# Patient Record
Sex: Male | Born: 1955 | Race: White | Hispanic: No | Marital: Married | Smoking: Current some day smoker
Health system: Southern US, Community
[De-identification: ages and names within clinical notes are randomized; demographics above are authoritative.]

## PROBLEM LIST (undated history)

## (undated) DIAGNOSIS — B1920 Unspecified viral hepatitis C without hepatic coma: Secondary | ICD-10-CM

## (undated) DIAGNOSIS — I10 Essential (primary) hypertension: Secondary | ICD-10-CM

---

## 2002-06-03 ENCOUNTER — Encounter: Payer: Self-pay | Admitting: Gastroenterology

## 2002-06-03 ENCOUNTER — Ambulatory Visit (HOSPITAL_COMMUNITY): Admission: RE | Admit: 2002-06-03 | Discharge: 2002-06-03 | Payer: Self-pay | Admitting: Gastroenterology

## 2002-06-03 ENCOUNTER — Encounter (INDEPENDENT_AMBULATORY_CARE_PROVIDER_SITE_OTHER): Payer: Self-pay | Admitting: Specialist

## 2004-01-04 ENCOUNTER — Encounter: Admission: RE | Admit: 2004-01-04 | Discharge: 2004-01-04 | Payer: Self-pay | Admitting: Family Medicine

## 2005-09-29 ENCOUNTER — Encounter: Admission: RE | Admit: 2005-09-29 | Discharge: 2005-09-29 | Payer: Self-pay | Admitting: Otolaryngology

## 2009-06-21 ENCOUNTER — Emergency Department (HOSPITAL_BASED_OUTPATIENT_CLINIC_OR_DEPARTMENT_OTHER): Admission: EM | Admit: 2009-06-21 | Discharge: 2009-06-21 | Payer: Self-pay | Admitting: Emergency Medicine

## 2009-06-21 ENCOUNTER — Ambulatory Visit: Payer: Self-pay | Admitting: Diagnostic Radiology

## 2010-07-14 IMAGING — CT CT HEAD W/O CM
1 series · 16 of 30 positions shown, 20 images · non-contrast
Comparison: 09/29/2005

CLINICAL DATA: Altered mental status

CT HEAD WITHOUT CONTRAST
TECHNIQUE: Contiguous axial images were obtained from the base of
the skull through the vertex without contrast.

[Series 2: head 4.8 h37s · axial · 0.46mm/px · z∈[+1197,+1334]mm · 16 of 32 slices shown, 20 images]
[im 2/32  brain]
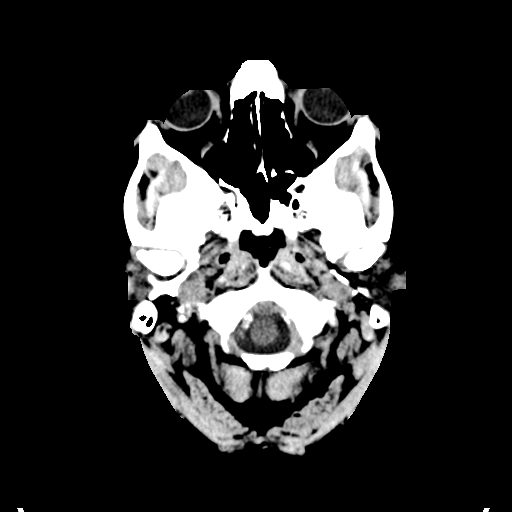
[im 2/32  bone]
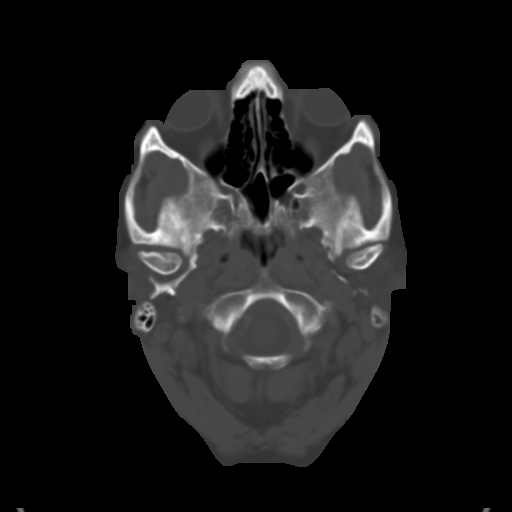
[im 4/32  brain]
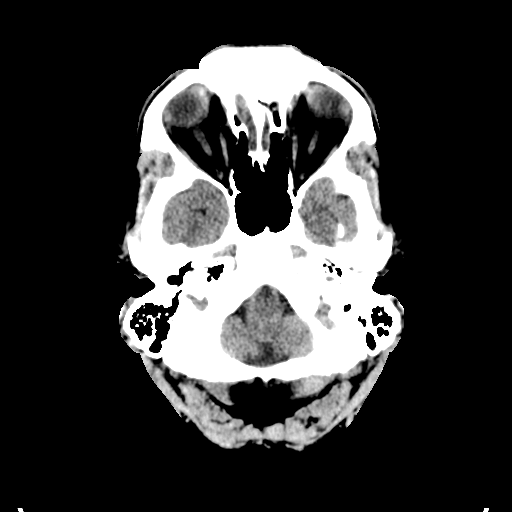
[im 6/32  brain]
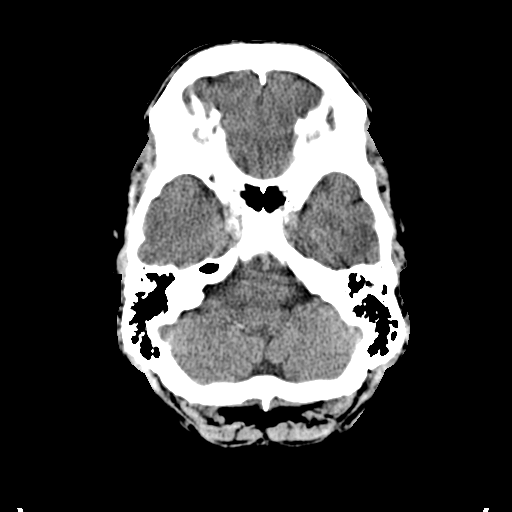
[im 8/32  brain]
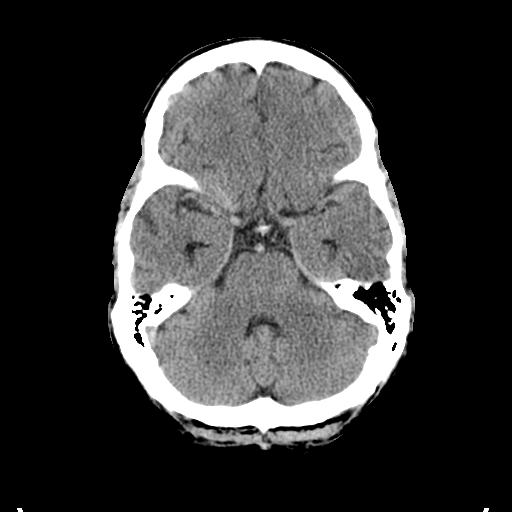
[im 9/32  brain]
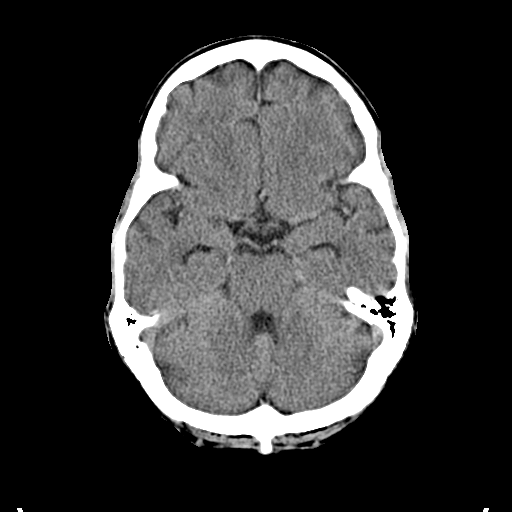
[im 9/32  bone]
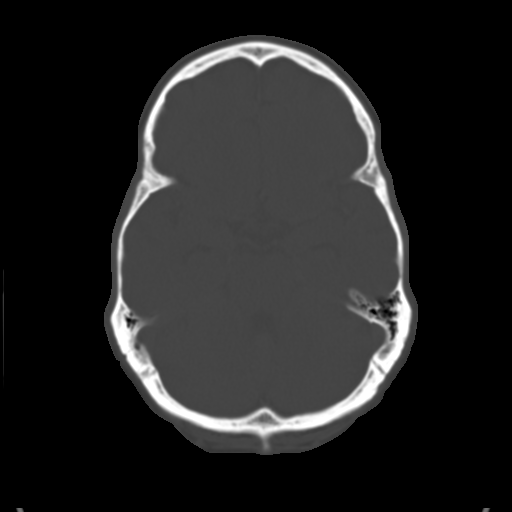
[im 11/32  brain]
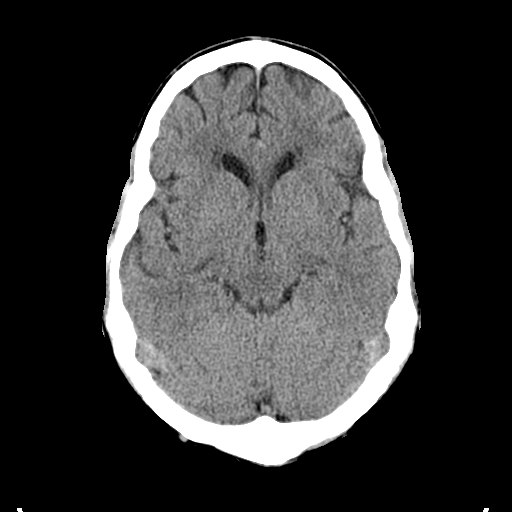
[im 13/32  brain]
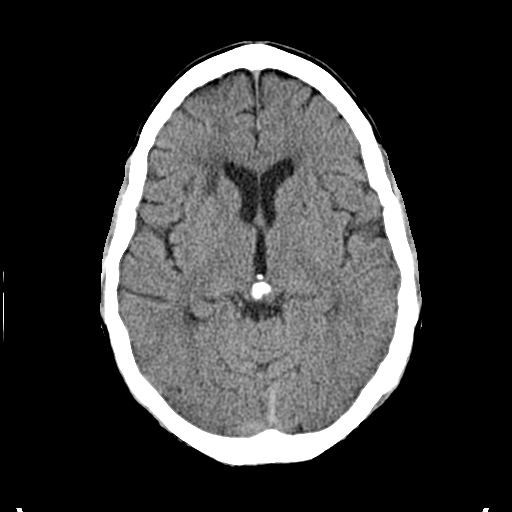
[im 15/32  brain]
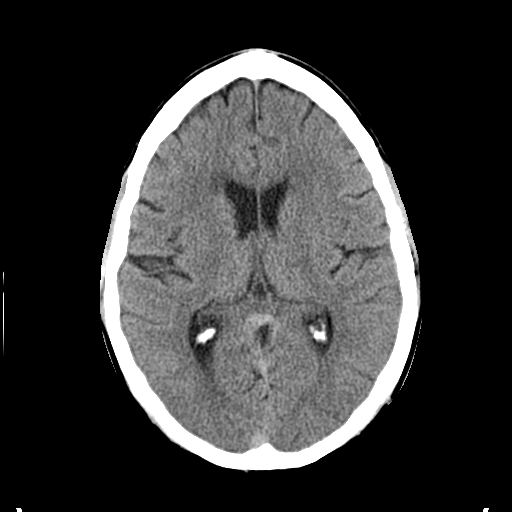
[im 17/32  brain]
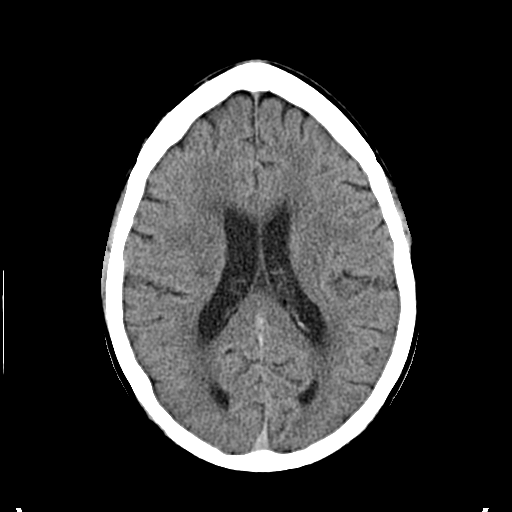
[im 17/32  bone]
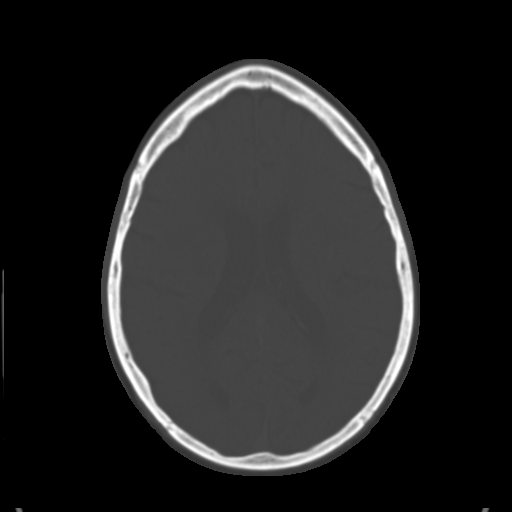
[im 19/32  brain]
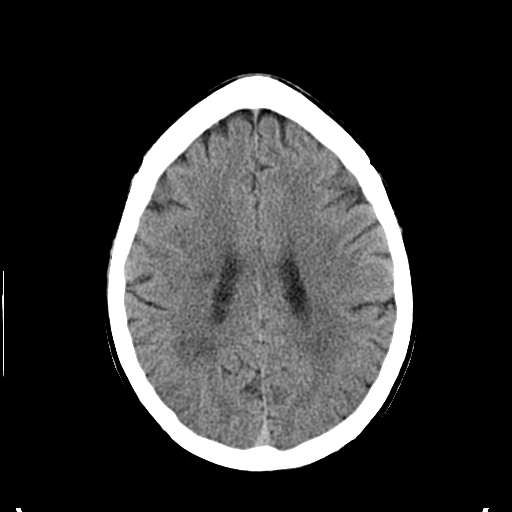
[im 21/32  brain]
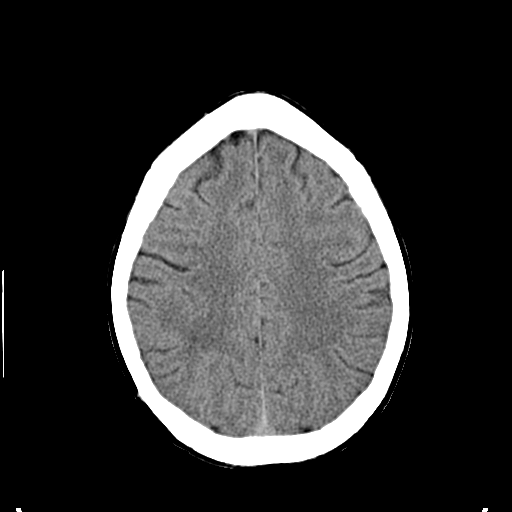
[im 23/32  brain]
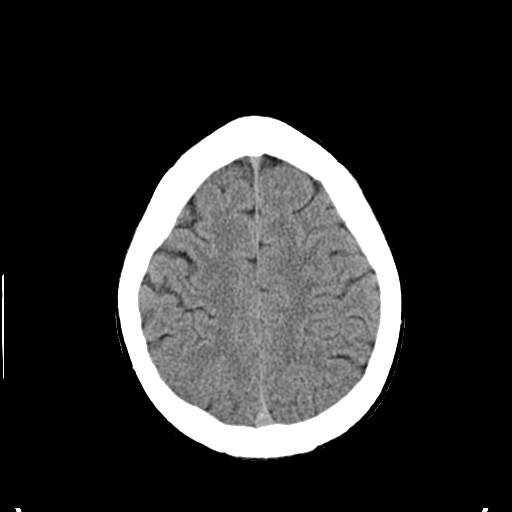
[im 24/32  brain]
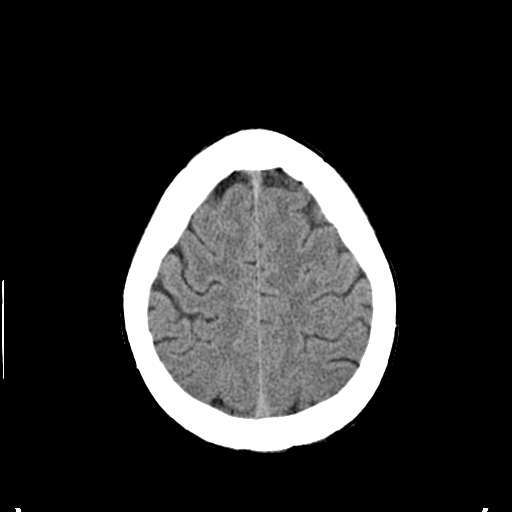
[im 24/32  bone]
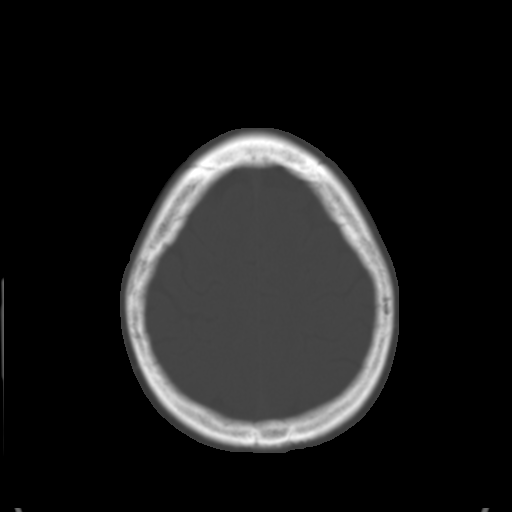
[im 26/32  brain]
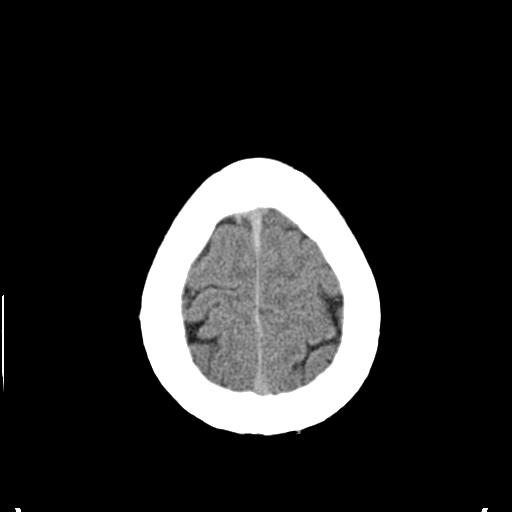
[im 28/32  brain]
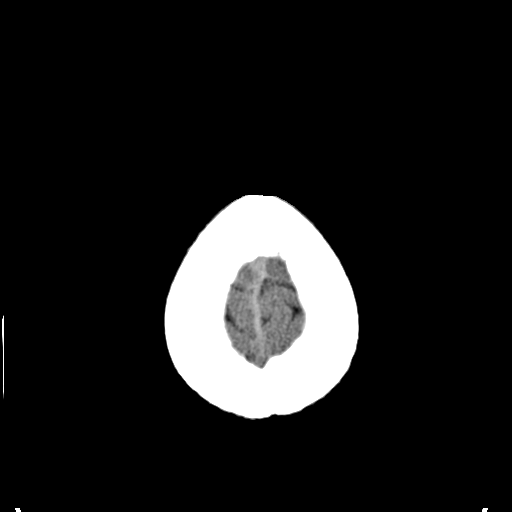
[im 30/32  brain]
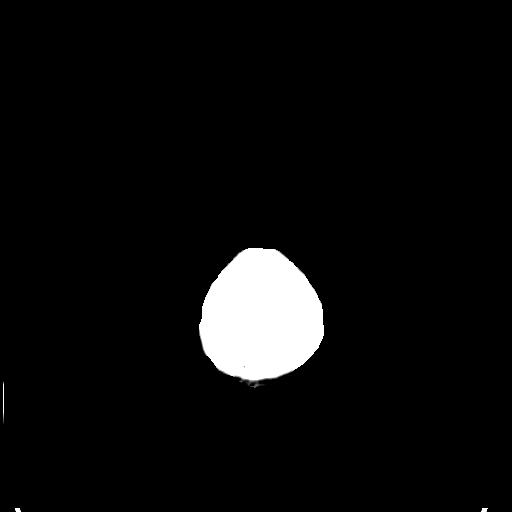

[16 of 30 positions shown; findings below may reference images not displayed]

FINDINGS: Chronic ischemic changes are present in the
periventricular white matter of the right parietal and frontal
lobes.  Small chronic infarct in the right corona radiata. No mass
effect, midline shift, or acute intracranial hemorrhage.  Mastoid
air cells and visualized paranasal sinuses are clear.
IMPRESSION: No acute intracranial pathology.  Chronic ischemic changes are
noted.

## 2010-07-14 IMAGING — CT CT ABDOMEN W/ CM
2 of 5 series · 16 of 46 positions shown, 18 images · IV contrast (APPLIED)
Comparison: CT abdomen pelvis 01/04/2004.

CT ABDOMEN

CLINICAL DATA: Intoxicated patient with elevated liver function
tests and acute confusion.

CT ABDOMEN AND PELVIS WITH CONTRAST  06/21/2009:
TECHNIQUE: Multidetector CT imaging of the abdomen and pelvis was
performed using the standard protocol following bolus
administration of intravenous contrast.
Contrast: 100 ml [id number] IV.

[Series 2: abd/pelvis 5.0 b31f · axial · 0.68mm/px · z∈[-430,-46]mm · 13 of 87 slices shown, 15 images]
[im 5/87  soft-tissue]
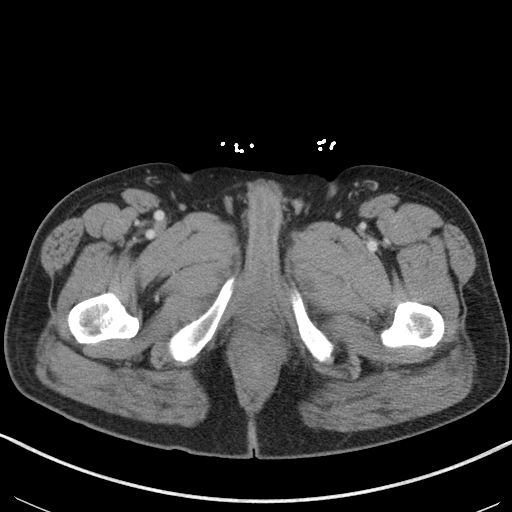
[im 5/87  bone]
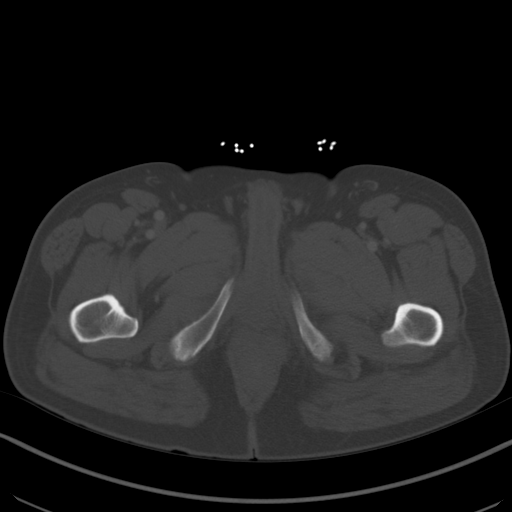
[im 14/87  soft-tissue]
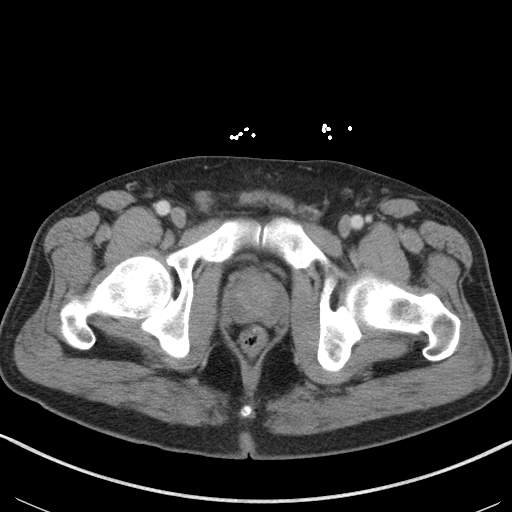
[im 19/87  soft-tissue]
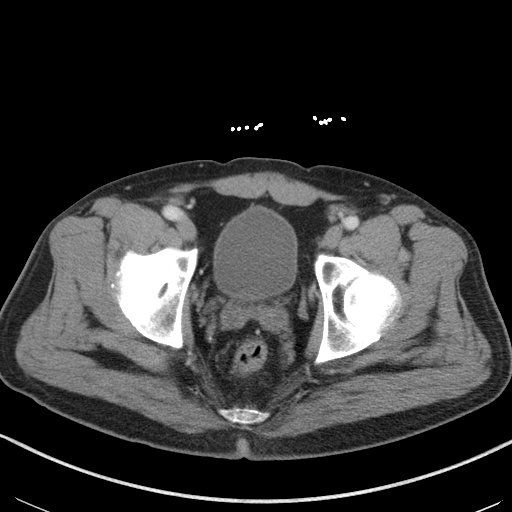
[im 23/87  soft-tissue]
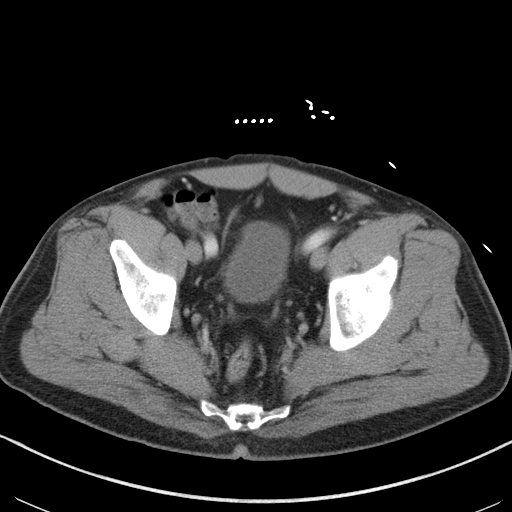
[im 32/87  soft-tissue]
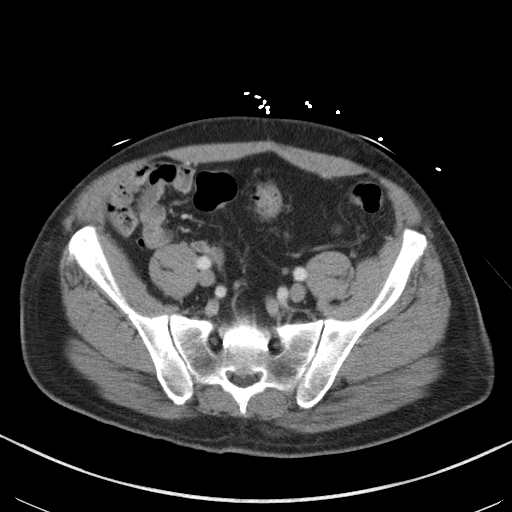
[im 37/87  soft-tissue]
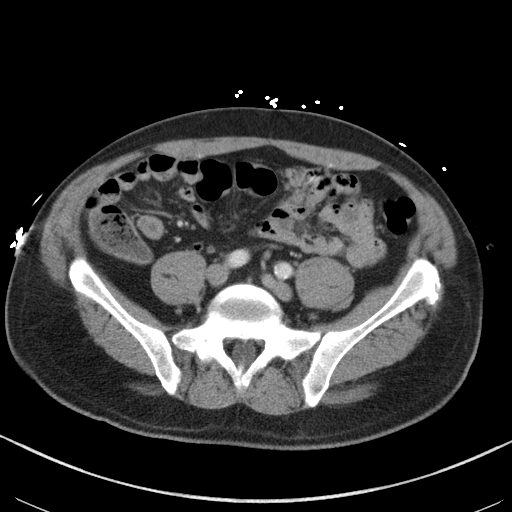
[im 46/87  soft-tissue]
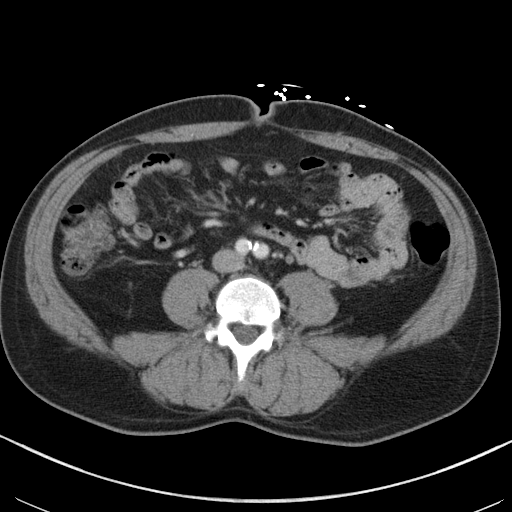
[im 50/87  soft-tissue]
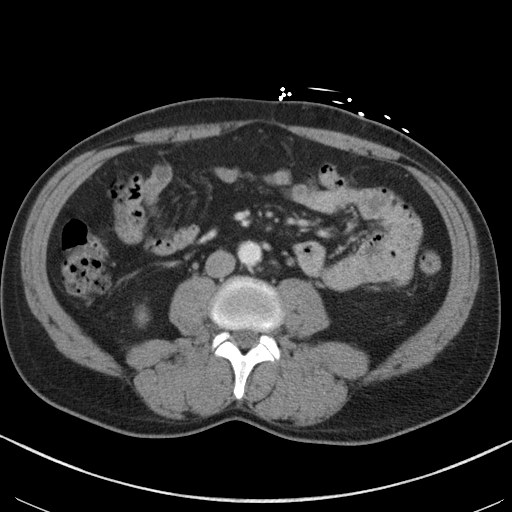
[im 55/87  soft-tissue]
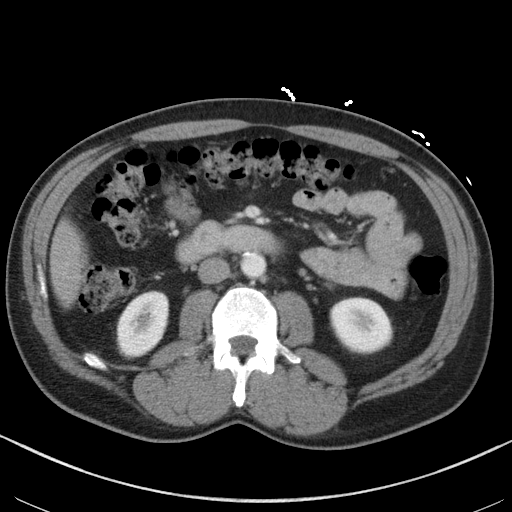
[im 55/87  bone]
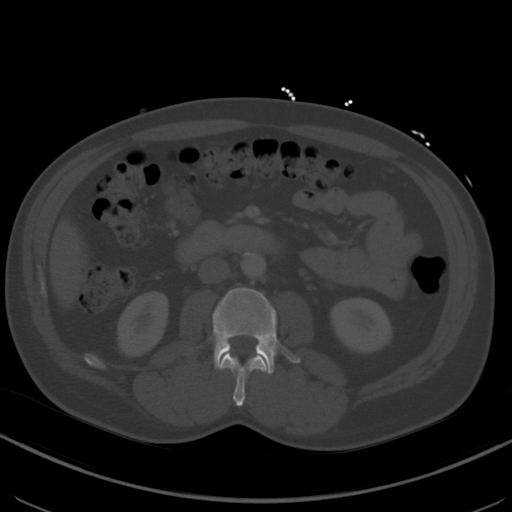
[im 64/87  soft-tissue]
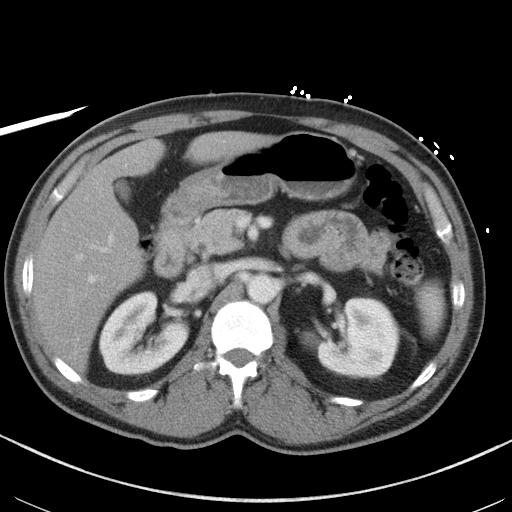
[im 68/87  soft-tissue]
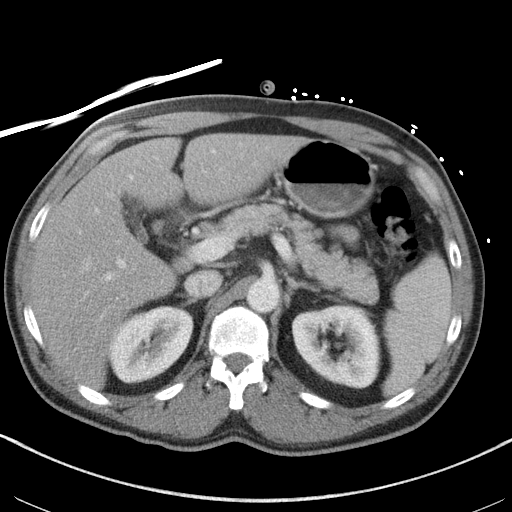
[im 73/87  soft-tissue]
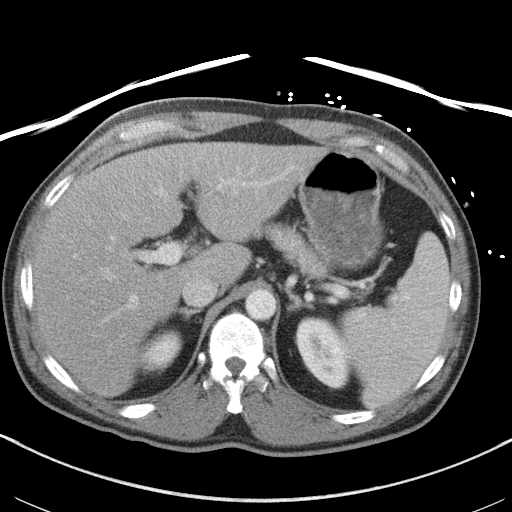
[im 82/87  soft-tissue]
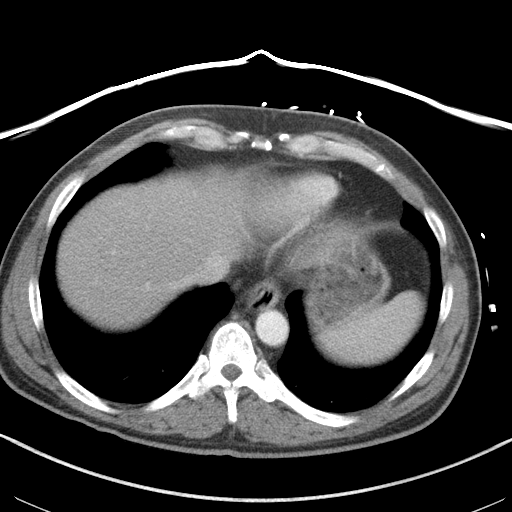

[Series 5: abd/pelvis 3.0 coronal · coronal · 0.66mm/px · 3 of 78 slices shown]
[im 26/78  soft-tissue]
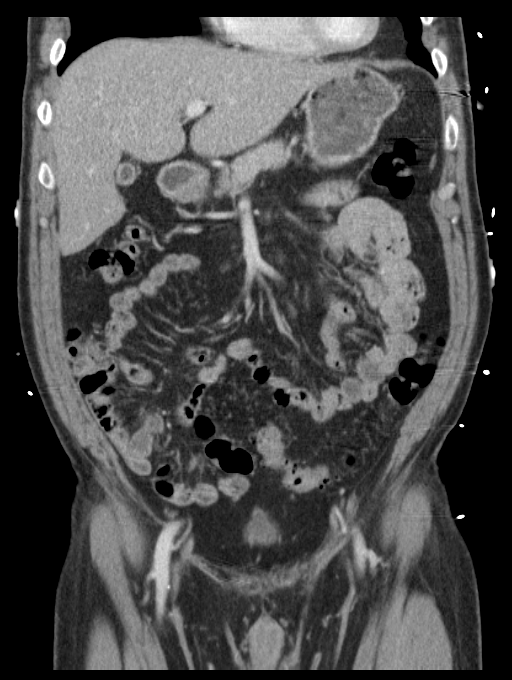
[im 35/78  soft-tissue]
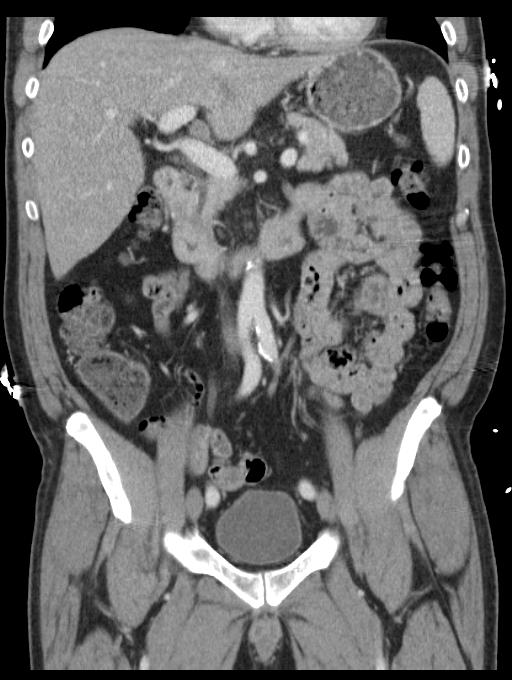
[im 43/78  soft-tissue]
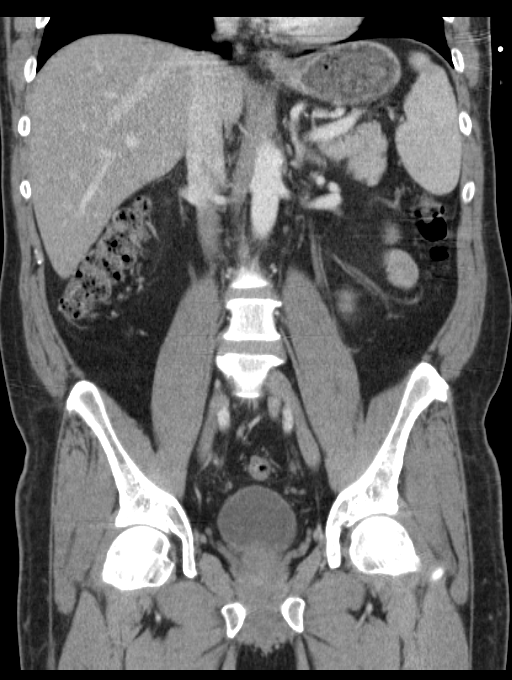

[16 of 46 positions shown; findings below may reference images not displayed]

FINDINGS: Mild diffuse fatty infiltration of the liver without
focal hepatic abnormality.  Normal appearing spleen, pancreas, and
adrenal glands.  Simple cysts arising from the lower pole of the
right kidney as before.  No significant abnormality involving
either kidney.  Large amount of food within a normal-appearing
stomach.  Contracted gallbladder which is otherwise unremarkable.
No biliary ductal dilation.  Visualized small bowel and colon
unremarkable.  Distal abdominal aortic atherosclerosis without
aneurysm.  No significant lymphadenopathy.  No ascites.  Visualized
lung bases clear apart from the expected dependent atelectasis
posteriorly.  Bone window images demonstrate mild degenerative
changes in the lower lumbar spine.
IMPRESSION: 1.  Mild diffuse fatty infiltration of the liver without focal
hepatic abnormality.
2.  No acute abnormalities otherwise in the abdomen.

CT PELVIS
FINDINGS: Iliofemoral atherosclerosis without aneurysm.  Sigmoid
colon diverticulosis without evidence of acute diverticulitis.
Normal appendix in the right upper pelvis.  Visualized small bowel
unremarkable.  Urinary bladder decompressed and unremarkable.
Prostate gland upper normal in size.  Normal seminal vesicles.  No
ascites.  No significant lymphadenopathy.  Bone window images
unremarkable.
IMPRESSION: 1.  No acute abnormalities in the pelvis.
2.  Sigmoid colon diverticulosis.

## 2011-04-07 LAB — URINALYSIS, ROUTINE W REFLEX MICROSCOPIC
Bilirubin Urine: NEGATIVE
Glucose, UA: NEGATIVE mg/dL
Hgb urine dipstick: NEGATIVE
Specific Gravity, Urine: 1.013 (ref 1.005–1.030)
Urobilinogen, UA: 0.2 mg/dL (ref 0.0–1.0)
pH: 6.5 (ref 5.0–8.0)

## 2011-04-07 LAB — CBC
HCT: 48.7 % (ref 39.0–52.0)
Hemoglobin: 16.8 g/dL (ref 13.0–17.0)
MCHC: 34.4 g/dL (ref 30.0–36.0)
Platelets: 215 10*3/uL (ref 150–400)
RDW: 11.9 % (ref 11.5–15.5)

## 2011-04-07 LAB — POCT TOXICOLOGY PANEL

## 2011-04-07 LAB — ETHANOL: Alcohol, Ethyl (B): 156 mg/dL — ABNORMAL HIGH (ref 0–10)

## 2011-04-07 LAB — COMPREHENSIVE METABOLIC PANEL
Albumin: 3.9 g/dL (ref 3.5–5.2)
BUN: 9 mg/dL (ref 6–23)
Calcium: 9 mg/dL (ref 8.4–10.5)
Glucose, Bld: 99 mg/dL (ref 70–99)
Potassium: 3.7 mEq/L (ref 3.5–5.1)
Total Protein: 7.1 g/dL (ref 6.0–8.3)

## 2011-04-07 LAB — AMMONIA: Ammonia: 28 umol/L (ref 11–35)

## 2011-04-07 LAB — DIFFERENTIAL
Lymphocytes Relative: 40 % (ref 12–46)
Lymphs Abs: 2.4 10*3/uL (ref 0.7–4.0)
Monocytes Absolute: 0.6 10*3/uL (ref 0.1–1.0)
Monocytes Relative: 9 % (ref 3–12)
Neutro Abs: 3 10*3/uL (ref 1.7–7.7)
Neutrophils Relative %: 49 % (ref 43–77)

## 2011-04-07 LAB — POCT CARDIAC MARKERS
CKMB, poc: <1 ng/mL — ABNORMAL LOW (ref 1.0–8.0)
Troponin i, poc: <0.05 ng/mL (ref 0.00–0.09)

## 2012-03-08 ENCOUNTER — Other Ambulatory Visit: Payer: Self-pay | Admitting: Gastroenterology

## 2014-04-01 ENCOUNTER — Encounter (HOSPITAL_BASED_OUTPATIENT_CLINIC_OR_DEPARTMENT_OTHER): Payer: Self-pay | Admitting: Emergency Medicine

## 2014-04-01 ENCOUNTER — Emergency Department (HOSPITAL_BASED_OUTPATIENT_CLINIC_OR_DEPARTMENT_OTHER)
Admission: EM | Admit: 2014-04-01 | Discharge: 2014-04-01 | Disposition: A | Discharge status: Home / Self Care | Payer: No Typology Code available for payment source | Attending: Emergency Medicine | Admitting: Emergency Medicine

## 2014-04-01 DIAGNOSIS — R1011 Right upper quadrant pain: Secondary | ICD-10-CM | POA: Insufficient documentation

## 2014-04-01 DIAGNOSIS — Z8619 Personal history of other infectious and parasitic diseases: Secondary | ICD-10-CM | POA: Insufficient documentation

## 2014-04-01 DIAGNOSIS — R160 Hepatomegaly, not elsewhere classified: Secondary | ICD-10-CM | POA: Insufficient documentation

## 2014-04-01 DIAGNOSIS — I10 Essential (primary) hypertension: Secondary | ICD-10-CM | POA: Insufficient documentation

## 2014-04-01 DIAGNOSIS — R630 Anorexia: Secondary | ICD-10-CM | POA: Insufficient documentation

## 2014-04-01 DIAGNOSIS — F17200 Nicotine dependence, unspecified, uncomplicated: Secondary | ICD-10-CM | POA: Insufficient documentation

## 2014-04-01 DIAGNOSIS — R109 Unspecified abdominal pain: Secondary | ICD-10-CM

## 2014-04-01 HISTORY — DX: Unspecified viral hepatitis C without hepatic coma: B19.20

## 2014-04-01 HISTORY — DX: Essential (primary) hypertension: I10

## 2014-04-01 LAB — URINALYSIS, ROUTINE W REFLEX MICROSCOPIC
GLUCOSE, UA: NEGATIVE mg/dL
HGB URINE DIPSTICK: NEGATIVE
Ketones, ur: 15 mg/dL — AB
Leukocytes, UA: NEGATIVE
Nitrite: NEGATIVE
PH: 6 (ref 5.0–8.0)
PROTEIN: NEGATIVE mg/dL
Specific Gravity, Urine: 1.025 (ref 1.005–1.030)
Urobilinogen, UA: 0.2 mg/dL (ref 0.0–1.0)

## 2014-04-01 MED ORDER — VALSARTAN 80 MG PO TABS: 80.0000 mg | ORAL_TABLET | Freq: Every day | ORAL | Status: AC

## 2014-04-01 MED ORDER — AMLODIPINE BESYLATE 10 MG PO TABS: 10.0000 mg | ORAL_TABLET | Freq: Every day | ORAL | Status: AC

## 2014-04-01 NOTE — Discharge Instructions (Signed)
Arterial Hypertension °Arterial hypertension (high blood pressure) is a condition of elevated pressure in your blood vessels. Hypertension over a long period of time is a risk factor for strokes, heart attacks, and heart failure. It is also the leading cause of kidney (renal) failure.  °CAUSES  °· In Adults -- Over 90% of all hypertension has no known cause. This is called essential or primary hypertension. In the other 10% of people with hypertension, the increase in blood pressure is caused by another disorder. This is called secondary hypertension. Important causes of secondary hypertension are: °· Heavy alcohol use. °· Obstructive sleep apnea. °· Hyperaldosterosim (Conn's syndrome). °· Steroid use. °· Chronic kidney failure. °· Hyperparathyroidism. °· Medications. °· Renal artery stenosis. °· Pheochromocytoma. °· Cushing's disease. °· Coarctation of the aorta. °· Scleroderma renal crisis. °· Licorice (in excessive amounts). °· Drugs (cocaine, methamphetamine). °Your caregiver can explain any items above that apply to you. °· In Children -- Secondary hypertension is more common and should always be considered. °· Pregnancy -- Few women of childbearing age have high blood pressure. However, up to 10% of them develop hypertension of pregnancy. Generally, this will not harm the woman. It may be a sign of 3 complications of pregnancy: preeclampsia, HELLP syndrome, and eclampsia. Follow up and control with medication is necessary. °SYMPTOMS  °· This condition normally does not produce any noticeable symptoms. It is usually found during a routine exam. °· Malignant hypertension is a late problem of high blood pressure. It may have the following symptoms: °· Headaches. °· Blurred vision. °· End-organ damage (this means your kidneys, heart, lungs, and other organs are being damaged). °· Stressful situations can increase the blood pressure. If a person with normal blood pressure has their blood pressure go up while being  seen by their caregiver, this is often termed "white coat hypertension." Its importance is not known. It may be related with eventually developing hypertension or complications of hypertension. °· Hypertension is often confused with mental tension, stress, and anxiety. °DIAGNOSIS  °The diagnosis is made by 3 separate blood pressure measurements. They are taken at least 1 week apart from each other. If there is organ damage from hypertension, the diagnosis may be made without repeat measurements. °Hypertension is usually identified by having blood pressure readings: °· Above 140/90 mmHg measured in both arms, at 3 separate times, over a couple weeks. °· Over 130/80 mmHg should be considered a risk factor and may require treatment in patients with diabetes. °Blood pressure readings over 120/80 mmHg are called "pre-hypertension" even in non-diabetic patients. °To get a true blood pressure measurement, use the following guidelines. Be aware of the factors that can alter blood pressure readings. °· Take measurements at least 1 hour after caffeine. °· Take measurements 30 minutes after smoking and without any stress. This is another reason to quit smoking  it raises your blood pressure. °· Use a proper cuff size. Ask your caregiver if you are not sure about your cuff size. °· Most home blood pressure cuffs are automatic. They will measure systolic and diastolic pressures. The systolic pressure is the pressure reading at the start of sounds. Diastolic pressure is the pressure at which the sounds disappear. If you are elderly, measure pressures in multiple postures. Try sitting, lying or standing. °· Sit at rest for a minimum of 5 minutes before taking measurements. °· You should not be on any medications like decongestants. These are found in many cold medications. °· Record your blood pressure readings and review   them with your caregiver. °If you have hypertension: °· Your caregiver may do tests to be sure you do not have  secondary hypertension (see "causes" above). °· Your caregiver may also look for signs of metabolic syndrome. This is also called Syndrome X or Insulin Resistance Syndrome. You may have this syndrome if you have type 2 diabetes, abdominal obesity, and abnormal blood lipids in addition to hypertension. °· Your caregiver will take your medical and family history and perform a physical exam. °· Diagnostic tests may include blood tests (for glucose, cholesterol, potassium, and kidney function), a urinalysis, or an EKG. Other tests may also be necessary depending on your condition. °PREVENTION  °There are important lifestyle issues that you can adopt to reduce your chance of developing hypertension: °· Maintain a normal weight. °· Limit the amount of salt (sodium) in your diet. °· Exercise often. °· Limit alcohol intake. °· Get enough potassium in your diet. Discuss specific advice with your caregiver. °· Follow a DASH diet (dietary approaches to stop hypertension). This diet is rich in fruits, vegetables, and low-fat dairy products, and avoids certain fats. °PROGNOSIS  °Essential hypertension cannot be cured. Lifestyle changes and medical treatment can lower blood pressure and reduce complications. The prognosis of secondary hypertension depends on the underlying cause. Many people whose hypertension is controlled with medicine or lifestyle changes can live a normal, healthy life.  °RISKS AND COMPLICATIONS  °While high blood pressure alone is not an illness, it often requires treatment due to its short- and long-term effects on many organs. Hypertension increases your risk for: °· CVAs or strokes (cerebrovascular accident). °· Heart failure due to chronically high blood pressure (hypertensive cardiomyopathy). °· Heart attack (myocardial infarction). °· Damage to the retina (hypertensive retinopathy). °· Kidney failure (hypertensive nephropathy). °Your caregiver can explain list items above that apply to you. Treatment  of hypertension can significantly reduce the risk of complications. °TREATMENT  °· For overweight patients, weight loss and regular exercise are recommended. Physical fitness lowers blood pressure. °· Mild hypertension is usually treated with diet and exercise. A diet rich in fruits and vegetables, fat-free dairy products, and foods low in fat and salt (sodium) can help lower blood pressure. Decreasing salt intake decreases blood pressure in a 1/3 of people. °· Stop smoking if you are a smoker. °The steps above are highly effective in reducing blood pressure. While these actions are easy to suggest, they are difficult to achieve. Most patients with moderate or severe hypertension end up requiring medications to bring their blood pressure down to a normal level. There are several classes of medications for treatment. Blood pressure pills (antihypertensives) will lower blood pressure by their different actions. Lowering the blood pressure by 10 mmHg may decrease the risk of complications by as much as 25%. °The goal of treatment is effective blood pressure control. This will reduce your risk for complications. Your caregiver will help you determine the best treatment for you according to your lifestyle. What is excellent treatment for one person, may not be for you. °HOME CARE INSTRUCTIONS  °· Do not smoke. °· Follow the lifestyle changes outlined in the "Prevention" section. °· If you are on medications, follow the directions carefully. Blood pressure medications must be taken as prescribed. Skipping doses reduces their benefit. It also puts you at risk for problems. °· Follow up with your caregiver, as directed. °· If you are asked to monitor your blood pressure at home, follow the guidelines in the "Diagnosis" section above. °SEEK MEDICAL CARE   IF:  °· You think you are having medication side effects. °· You have recurrent headaches or lightheadedness. °· You have swelling in your ankles. °· You have trouble with  your vision. °SEEK IMMEDIATE MEDICAL CARE IF:  °· You have sudden onset of chest pain or pressure, difficulty breathing, or other symptoms of a heart attack. °· You have a severe headache. °· You have symptoms of a stroke (such as sudden weakness, difficulty speaking, difficulty walking). °MAKE SURE YOU:  °· Understand these instructions. °· Will watch your condition. °· Will get help right away if you are not doing well or get worse. °Document Released: 12/15/2005 Document Revised: 03/08/2012 Document Reviewed: 07/15/2007 °[employer]® Patient Information ©2014 [employer]. ° °

## 2014-04-01 NOTE — ED Provider Notes (Addendum)
CSN: [id number]     Arrival date & time 04/01/14  1249 History   First MD Initiated Contact with Patient 04/01/14 1252     Chief Complaint  Patient presents with  . Flank Pain     (Consider location/radiation/quality/duration/timing/severity/associated sxs/prior Treatment) Patient is a 58 y.o. male presenting with flank pain. The history is provided by the patient.  Flank Pain This is a new problem. Episode onset: 3-4 days ago. Episode frequency: intermittent. The problem has not changed since onset.Pertinent negatives include no chest pain, no abdominal pain and no shortness of breath. Associated symptoms comments: Darker urine and states has not had much of an appetite.  Pt denies N/V/D.  Not worsened with food.  Pain is worse at night and seems to be worse over the left flank. Nothing aggravates the symptoms. Nothing relieves the symptoms. He has tried nothing for the symptoms. The treatment provided no relief.    Past Medical History  Diagnosis Date  . Hypertension   . Hepatitis C    History reviewed. No pertinent past surgical history. No family history on file. History  Substance Use Topics  . Smoking status: Current Some Day Smoker  . Smokeless tobacco: Not on file  . Alcohol Use: Yes    Review of Systems  Respiratory: Negative for shortness of breath.   Cardiovascular: Negative for chest pain.  Gastrointestinal: Negative for abdominal pain.  Genitourinary: Positive for flank pain.  All other systems reviewed and are negative.      Allergies  Review of patient's allergies indicates no known allergies.  Home Medications  No current outpatient prescriptions on file. BP 183/118  Pulse 72  Temp(Src) 97.9 F (36.6 C) (Oral)  Resp 18  SpO2 100% Physical Exam  Nursing note and vitals reviewed. Constitutional: He is oriented to person, place, and time. He appears well-developed and well-nourished. No distress.  HENT:  Head: Normocephalic and atraumatic.   Mouth/Throat: Oropharynx is clear and moist.  Eyes: Conjunctivae and EOM are normal. Pupils are equal, round, and reactive to light.  Neck: Normal range of motion. Neck supple.  Cardiovascular: Normal rate, regular rhythm and intact distal pulses.   No murmur heard. Pulmonary/Chest: Effort normal and breath sounds normal. No respiratory distress. He has no wheezes. He has no rales.  Abdominal: Soft. Normal appearance. He exhibits no distension. There is tenderness in the right upper quadrant. There is CVA tenderness. There is no rebound and no guarding.  Mild enlargement of the liver and mild tenderness.  Tenderness over bilateral flank  Musculoskeletal: Normal range of motion. He exhibits no edema and no tenderness.  Neurological: He is alert and oriented to person, place, and time.  Skin: Skin is warm and dry. No rash noted. No erythema.  Psychiatric: He has a normal mood and affect. His behavior is normal.    ED Course  Procedures (including critical care time) Labs Review Labs Reviewed  URINALYSIS, ROUTINE W REFLEX MICROSCOPIC - Abnormal; Notable for the following:    Color, Urine AMBER (*)    Bilirubin Urine SMALL (*)    Ketones, ur 15 (*)    All other components within normal limits   Imaging Review No results found.   EKG Interpretation None      MDM   Final diagnoses:  Flank pain  Hypertension    Patient presenting four-day history of bilateral flank pain worse on the left that seems to be worse at night and is very sharp. He is known exacerbating factors no  associated symptoms except that his urine has looked darker and his appetite has not been normal. She has a history of alcohol use and hepatitis C. He usually drinks 4 glasses of wine daily which she states he's been doing for some time. She denies any painful urination and no prior history of kidney stones. Patient is hypertensive here but he has a history of hypertension and is not currently taking  medications. Pulses are intact with normal perfusion throughout. He denies any chest pain or shortness of breath. Symptoms are not classic for renal colic however could be atypical renal colic versus urinary tract infection versus a liver pathology or pancreatitis.  Low suspicion for AAA or dissection. However patient is refusing all blood work her CT scans and is requesting a urine be checked at this time.  1:40 PM UA without signs of blood or infection.  Findings discussed with pt and he states that he is feeling better now without intervention.  He refuses blood work at this time but states he does have the bottles of blood pressure medication in his car he is supposed to be taking but is out.  Will refill bp meds and pt encouraged to return if symptoms worsen.  Gwyneth Sprout, MD 04/01/14 1341  Gwyneth Sprout, MD 04/01/14 1350

## 2014-04-01 NOTE — ED Notes (Signed)
L side flank pain for four days, decrease in urination

## 2014-07-21 ENCOUNTER — Encounter (HOSPITAL_BASED_OUTPATIENT_CLINIC_OR_DEPARTMENT_OTHER): Payer: Self-pay | Admitting: Emergency Medicine

## 2014-07-21 ENCOUNTER — Emergency Department (HOSPITAL_BASED_OUTPATIENT_CLINIC_OR_DEPARTMENT_OTHER)
Admission: EM | Admit: 2014-07-21 | Discharge: 2014-07-21 | Disposition: A | Discharge status: Home / Self Care | Payer: No Typology Code available for payment source | Attending: Emergency Medicine | Admitting: Emergency Medicine

## 2014-07-21 DIAGNOSIS — H8112 Benign paroxysmal vertigo, left ear: Secondary | ICD-10-CM

## 2014-07-21 DIAGNOSIS — Z79899 Other long term (current) drug therapy: Secondary | ICD-10-CM | POA: Insufficient documentation

## 2014-07-21 DIAGNOSIS — I10 Essential (primary) hypertension: Secondary | ICD-10-CM | POA: Insufficient documentation

## 2014-07-21 DIAGNOSIS — R42 Dizziness and giddiness: Secondary | ICD-10-CM | POA: Insufficient documentation

## 2014-07-21 DIAGNOSIS — F17200 Nicotine dependence, unspecified, uncomplicated: Secondary | ICD-10-CM | POA: Insufficient documentation

## 2014-07-21 DIAGNOSIS — Z8619 Personal history of other infectious and parasitic diseases: Secondary | ICD-10-CM | POA: Insufficient documentation

## 2014-07-21 DIAGNOSIS — H8110 Benign paroxysmal vertigo, unspecified ear: Secondary | ICD-10-CM | POA: Insufficient documentation

## 2014-07-21 MED ORDER — MECLIZINE HCL 25 MG PO TABS
50.0000 mg | ORAL_TABLET | Freq: Once | ORAL | Status: AC
  Administered 2014-07-21: 50 mg via ORAL
  Filled 2014-07-21: qty 2

## 2014-07-21 MED ORDER — MECLIZINE HCL 50 MG PO TABS: 50.0000 mg | ORAL_TABLET | Freq: Three times a day (TID) | ORAL | Status: AC | PRN

## 2014-07-21 NOTE — ED Notes (Signed)
Pt to room 1 in w/c, cg@ provided for transfer to bed. Pt reports sudden onset of left ear pain and "the earth started spinning". Pt states he vomited twice. Pt states the room is no longer spinning nor does he currently feel nauseaous, c/o "buzzing" in his left ear.

## 2014-07-21 NOTE — ED Provider Notes (Signed)
CSN: [id number]     Arrival date & time 07/21/14  1502 History   First MD Initiated Contact with Patient 07/21/14 1511     Chief Complaint  Patient presents with  . Dizziness     (Consider location/radiation/quality/duration/timing/severity/associated sxs/prior Treatment) HPI  Brian Richards is a 58 y.o. male with past medical history significant for hepatitis C and hypertension complaining of acute onset of vertiginous sensation this with nausea this a.m. Patient has had 4 episodes today. Associated symptoms of tinnitus in the left ear and states that he can't hear as well: " like was on a plane." Patient denies any ataxia, prior similar episodes, chest pain, shortness of breath, emesis, otalgia, runny nose, fever, chills.  Past Medical History  Diagnosis Date  . Hypertension   . Hepatitis C    History reviewed. No pertinent past surgical history. History reviewed. No pertinent family history. History  Substance Use Topics  . Smoking status: Current Some Day Smoker  . Smokeless tobacco: Not on file  . Alcohol Use: Yes    Review of Systems  10 systems reviewed and found to be negative, except as noted in the HPI.   Allergies  Review of patient's allergies indicates no known allergies.  Home Medications   Prior to Admission medications   Medication Sig Start Date End Date Taking? Authorizing Provider  amLODipine (NORVASC) 10 MG tablet Take 1 tablet (10 mg total) by mouth daily. 04/01/14   Gwyneth SproutWhitney Plunkett, MD  meclizine (ANTIVERT) 50 MG tablet Take 1 tablet (50 mg total) by mouth 3 (three) times daily as needed for dizziness or nausea. 07/21/14   Luisalberto Beegle, PA-C  meclizine (ANTIVERT) 50 MG tablet Take 1 tablet (50 mg total) by mouth 3 (three) times daily as needed for dizziness or nausea. 07/21/14   Aunya Lemler, PA-C  valsartan (DIOVAN) 80 MG tablet Take 1 tablet (80 mg total) by mouth daily. 04/01/14   Gwyneth SproutWhitney Plunkett, MD   BP 160/96  Pulse 72  Temp(Src) 97.5 F  (36.4 C) (Oral)  Resp 18  SpO2 100% Physical Exam  Nursing note and vitals reviewed. Constitutional: He is oriented to person, place, and time. He appears well-developed and well-nourished. No distress.  HENT:  Head: Normocephalic.  Eyes: Conjunctivae and EOM are normal.  Cardiovascular: Normal rate and regular rhythm.   Pulmonary/Chest: Effort normal and breath sounds normal. No stridor.  Abdominal: Soft. Bowel sounds are normal.  Musculoskeletal: Normal range of motion.  Neurological: He is alert and oriented to person, place, and time.  Hints exam negative. Negative Dicks Hallpike bilaterally.  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 Sensation intact.   II-Visual fields grossly intact. III/IV/VI-Extraocular movements intact.  Pupils reactive bilaterally. V/VII-Smile symmetric, equal eyebrow raise,  facial sensation intact VIII- Hearing grossly intact IX/X-Normal gag XI-bilateral shoulder shrug XII-midline tongue extension Cerebellar: Normal finger-to-nose  and normal heel-to-shin test.   Romberg negative Ambulates with a coordinated gait   Psychiatric: He has a normal mood and affect.    ED Course  Procedures (including critical care time) Labs Review Labs Reviewed - No data to display  Imaging Review No results found.   EKG Interpretation   Date/Time:  Friday July 21 2014 15:18:07 EDT Ventricular Rate:  90 PR Interval:  118 QRS Duration: 82 QT Interval:  384 QTC Calculation: 469 R Axis:   60 Text Interpretation:  Normal sinus  rhythm Normal ECG since last tracing no  significant change Confirmed by BELFI  MD, MELANIE ([id number]) on 07/21/2014  4:05:53 PM      MDM   Final diagnoses:  BPPV (benign paroxysmal positional vertigo), left    Filed Vitals:   07/21/14 1513  BP: 160/96  Pulse: 72  Temp: 97.5 F (36.4 C)  TempSrc: Oral  Resp: 18   SpO2: 100%    Medications  meclizine (ANTIVERT) tablet 50 mg (50 mg Oral Given 07/21/14 1541)    Brian Richards is a 58 y.o. male presenting with 4 episodes of vertiginous sensation short-lived, acute onset today. Neuro exam is nonfocal, typical for his baseline (note the patient has foot drop secondary to pinched nerve). Vertigo is resolved at this point. Patient has negative hints exam. Negative Dicks Hallpike. I presented him out information on Apley maneuver. I have written him a prescription for meclizine. We have had extensive discussions on importance of following with primary care. This appears to be a peripheral vertigo. There are no signs of central lesion. In fact, vertigo appears to be resolved at this time completely. I have advised him not to do Dix-Hallpike or use the meclizine unless symptoms return. I've given him in his and throat followup however, stated it is most important to follow with his primary care physician.  Evaluation does not show pathology that would require ongoing emergent intervention or inpatient treatment. Pt is hemodynamically stable and mentating appropriately. Discussed findings and plan with patient/guardian, who agrees with care plan. All questions answered. Return precautions discussed and outpatient follow up given.   Discharge Medication List as of 07/21/2014  4:11 PM    START taking these medications   Details  meclizine (ANTIVERT) 50 MG tablet Take 1 tablet (50 mg total) by mouth 3 (three) times daily as needed for dizziness or nausea., Starting 07/21/2014, Until Discontinued, Print             Wynetta Emery, PA-C 07/22/14 1908

## 2014-07-21 NOTE — Discharge Instructions (Signed)
Please follow with your primary care doctor in the next 2 days for a check-up. They must obtain records for further management.   Do not hesitate to return to the Emergency Department for any new, worsening or concerning symptoms.    Benign Positional Vertigo Vertigo means you feel like you or your surroundings are moving when they are not. Benign positional vertigo is the most common form of vertigo. Benign means that the cause of your condition is not serious. Benign positional vertigo is more common in older adults. CAUSES  Benign positional vertigo is the result of an upset in the labyrinth system. This is an area in the middle ear that helps control your balance. This may be caused by a viral infection, head injury, or repetitive motion. However, often no specific cause is found. SYMPTOMS  Symptoms of benign positional vertigo occur when you move your head or eyes in different directions. Some of the symptoms may include:  Loss of balance and falls.  Vomiting.  Blurred vision.  Dizziness.  Nausea.  Involuntary eye movements (nystagmus). DIAGNOSIS  Benign positional vertigo is usually diagnosed by physical exam. If the specific cause of your benign positional vertigo is unknown, your caregiver may perform imaging tests, such as magnetic resonance imaging (MRI) or computed tomography (CT). TREATMENT  Your caregiver may recommend movements or procedures to correct the benign positional vertigo. Medicines such as meclizine, benzodiazepines, and medicines for nausea may be used to treat your symptoms. In rare cases, if your symptoms are caused by certain conditions that affect the inner ear, you may need surgery. HOME CARE INSTRUCTIONS   Follow your caregiver's instructions.  Move slowly. Do not make sudden body or head movements.  Avoid driving.  Avoid operating heavy machinery.  Avoid performing any tasks that would be dangerous to you or others during a vertigo  episode.  Drink enough fluids to keep your urine clear or pale yellow. SEEK IMMEDIATE MEDICAL CARE IF:   You develop problems with walking, weakness, numbness, or using your arms, hands, or legs.  You have difficulty speaking.  You develop severe headaches.  Your nausea or vomiting continues or gets worse.  You develop visual changes.  Your family or friends notice any behavioral changes.  Your condition gets worse.  You have a fever.  You develop a stiff neck or sensitivity to light. MAKE SURE YOU:   Understand these instructions.  Will watch your condition.  Will get help right away if you are not doing well or get worse. Document Released: 09/22/2006 Document Revised: 03/08/2012 Document Reviewed: 09/04/2011 [facility] Patient Information 2015 [place], [place]. This information is not intended to replace advice given to you by your health care provider. Make sure you discuss any questions you have with your health care provider.  Epley Maneuver Self-Care WHAT IS THE EPLEY MANEUVER? The Epley maneuver is an exercise you can do to relieve symptoms of benign paroxysmal positional vertigo (BPPV). This condition is often just referred to as vertigo. BPPV is caused by the movement of tiny crystals (canaliths) inside your inner ear. The accumulation and movement of canaliths in your inner ear causes a sudden spinning sensation (vertigo) when you move your head to certain positions. Vertigo usually lasts about 30 seconds. BPPV usually occurs in just one ear. If you get vertigo when you lie on your left side, you probably have BPPV in your left ear. Your health care provider can tell you which ear is involved.  BPPV may be caused by a  head injury. Many people older than 50 get BPPV for unknown reasons. If you have been diagnosed with BPPV, your health care provider may teach you how to do this maneuver. BPPV is not life threatening (benign) and usually goes away in time.  WHEN SHOULD I  PERFORM THE EPLEY MANEUVER? You can do this maneuver at home whenever you have symptoms of vertigo. You may do the Epley maneuver up to 3 times a day until your symptoms of vertigo go away. HOW SHOULD I DO THE EPLEY MANEUVER? 1. Sit on the edge of a bed or table with your back straight. Your legs should be extended or hanging over the edge of the bed or table.  2. Turn your head halfway toward the affected ear.  3. Lie backward quickly with your head turned until you are lying flat on your back. You may want to position a pillow under your shoulders.  4. Hold this position for 30 seconds. You may experience an attack of vertigo. This is normal. Hold this position until the vertigo stops. 5. Then turn your head to the opposite direction until your unaffected ear is facing the floor.  6. Hold this position for 30 seconds. You may experience an attack of vertigo. This is normal. Hold this position until the vertigo stops. 7. Now turn your whole body to the same side as your head. Hold for another 30 seconds.  8. You can then sit back up. ARE THERE RISKS TO THIS MANEUVER? In some cases, you may have other symptoms (such as changes in your vision, weakness, or numbness). If you have these symptoms, stop doing the maneuver and call your health care provider. Even if doing these maneuvers relieves your vertigo, you may still have dizziness. Dizziness is the sensation of light-headedness but without the sensation of movement. Even though the Epley maneuver may relieve your vertigo, it is possible that your symptoms will return within 5 years. WHAT SHOULD I DO AFTER THIS MANEUVER? After doing the Epley maneuver, you can return to your normal activities. Ask your doctor if there is anything you should do at home to prevent vertigo. This may include:  Sleeping with two or more pillows to keep your head elevated.  Not sleeping on the side of your affected ear.  Getting up slowly from bed.  Avoiding  sudden movements during the day.  Avoiding extreme head movement, like looking up or bending over.  Wearing a cervical collar to prevent sudden head movements. WHAT SHOULD I DO IF MY SYMPTOMS GET WORSE? Call your health care provider if your vertigo gets worse. Call your provider right way if you have other symptoms, including:   Nausea.  Vomiting.  Headache.  Weakness.  Numbness.  Vision changes. Document Released: 12/20/2013 Document Reviewed: 12/20/2013 [facility] Patient Information 2015 [place], [place]. This information is not intended to replace advice given to you by your health care provider. Make sure you discuss any questions you have with your health care provider.

## 2014-07-22 NOTE — ED Provider Notes (Signed)
Medical screening examination/treatment/procedure(s) were performed by non-physician practitioner and as supervising physician I was immediately available for consultation/collaboration.   EKG Interpretation   Date/Time:  Friday July 21 2014 15:18:07 EDT Ventricular Rate:  90 PR Interval:  118 QRS Duration: 82 QT Interval:  384 QTC Calculation: 469 R Axis:   60 Text Interpretation:  Normal sinus rhythm Normal ECG since last tracing no  significant change Confirmed by Lashai Grosch  MD, Sharlisa Hollifield ([id number]) on 07/21/2014  4:05:53 PM        Rolan BuccoMelanie Taunia Frasco, MD 07/22/14 [id number]

## 2014-10-29 DEATH — deceased
